# Patient Record
Sex: Female | Born: 1960 | Race: Asian | Hispanic: No | Marital: Married | Smoking: Never smoker
Health system: Southern US, Community
[De-identification: ages and names within clinical notes are randomized; demographics above are authoritative.]

## PROBLEM LIST (undated history)

## (undated) DIAGNOSIS — R42 Dizziness and giddiness: Secondary | ICD-10-CM

## (undated) DIAGNOSIS — T7840XA Allergy, unspecified, initial encounter: Secondary | ICD-10-CM

## (undated) HISTORY — PX: RECTAL SURGERY: SHX760

## (undated) HISTORY — PX: TUBAL LIGATION: SHX77

## (undated) HISTORY — PX: BLADDER SURGERY: SHX569

## (undated) HISTORY — PX: HERNIA REPAIR: SHX51

## (undated) HISTORY — PX: ABDOMINAL HYSTERECTOMY: SHX81

---

## 2009-09-17 ENCOUNTER — Ambulatory Visit: Payer: Self-pay | Admitting: General Surgery

## 2009-10-26 ENCOUNTER — Ambulatory Visit: Payer: Self-pay | Admitting: General Surgery

## 2010-06-25 ENCOUNTER — Ambulatory Visit: Payer: Self-pay | Admitting: Unknown Physician Specialty

## 2011-09-28 ENCOUNTER — Emergency Department: Payer: Self-pay | Admitting: Emergency Medicine

## 2012-05-12 ENCOUNTER — Ambulatory Visit: Payer: Self-pay | Admitting: Internal Medicine

## 2012-06-25 DIAGNOSIS — R42 Dizziness and giddiness: Secondary | ICD-10-CM | POA: Insufficient documentation

## 2013-04-27 ENCOUNTER — Ambulatory Visit: Payer: Self-pay | Admitting: Internal Medicine

## 2013-04-28 DIAGNOSIS — K760 Fatty (change of) liver, not elsewhere classified: Secondary | ICD-10-CM | POA: Insufficient documentation

## 2013-08-30 ENCOUNTER — Ambulatory Visit: Payer: Self-pay | Admitting: Family Medicine

## 2015-09-28 ENCOUNTER — Encounter: Payer: Self-pay | Admitting: Emergency Medicine

## 2015-09-28 ENCOUNTER — Emergency Department
Admission: EM | Admit: 2015-09-28 | Discharge: 2015-09-28 | Disposition: A | Discharge status: Home / Self Care | Payer: BLUE CROSS/BLUE SHIELD | Attending: Emergency Medicine | Admitting: Emergency Medicine

## 2015-09-28 ENCOUNTER — Emergency Department: Payer: BLUE CROSS/BLUE SHIELD

## 2015-09-28 DIAGNOSIS — Z880 Allergy status to penicillin: Secondary | ICD-10-CM | POA: Insufficient documentation

## 2015-09-28 DIAGNOSIS — F419 Anxiety disorder, unspecified: Secondary | ICD-10-CM

## 2015-09-28 DIAGNOSIS — R0789 Other chest pain: Secondary | ICD-10-CM | POA: Diagnosis present

## 2015-09-28 HISTORY — DX: Allergy, unspecified, initial encounter: T78.40XA

## 2015-09-28 LAB — URINALYSIS COMPLETE WITH MICROSCOPIC (ARMC ONLY)
BACTERIA UA: NONE SEEN
Bilirubin Urine: NEGATIVE
Glucose, UA: NEGATIVE mg/dL
Hgb urine dipstick: NEGATIVE
Ketones, ur: NEGATIVE mg/dL
Leukocytes, UA: NEGATIVE
Nitrite: NEGATIVE
PROTEIN: NEGATIVE mg/dL
SPECIFIC GRAVITY, URINE: 1.009 (ref 1.005–1.030)
pH: 7 (ref 5.0–8.0)

## 2015-09-28 LAB — COMPREHENSIVE METABOLIC PANEL
ALBUMIN: 4.8 g/dL (ref 3.5–5.0)
ALT: 28 U/L (ref 14–54)
ANION GAP: 7 (ref 5–15)
AST: 26 U/L (ref 15–41)
Alkaline Phosphatase: 128 U/L — ABNORMAL HIGH (ref 38–126)
BUN: 14 mg/dL (ref 6–20)
CHLORIDE: 108 mmol/L (ref 101–111)
CO2: 26 mmol/L (ref 22–32)
Calcium: 9.4 mg/dL (ref 8.9–10.3)
Creatinine, Ser: 0.65 mg/dL (ref 0.44–1.00)
GFR calc Af Amer: >60 mL/min (ref >60)
Glucose, Bld: 99 mg/dL (ref 65–99)
POTASSIUM: 3.4 mmol/L — AB (ref 3.5–5.1)
Sodium: 141 mmol/L (ref 135–145)
TOTAL PROTEIN: 8.2 g/dL — AB (ref 6.5–8.1)
Total Bilirubin: 1.5 mg/dL — ABNORMAL HIGH (ref 0.3–1.2)

## 2015-09-28 LAB — CBC WITH DIFFERENTIAL/PLATELET
Basophils Absolute: 0.2 10*3/uL — ABNORMAL HIGH (ref 0–0.1)
Basophils Relative: 5 %
Eosinophils Absolute: 0.1 10*3/uL (ref 0–0.7)
Eosinophils Relative: 3 %
HEMATOCRIT: 48.1 % — AB (ref 35.0–47.0)
Hemoglobin: 16.3 g/dL — ABNORMAL HIGH (ref 12.0–16.0)
LYMPHS ABS: 1.2 10*3/uL (ref 1.0–3.6)
MCH: 29.5 pg (ref 26.0–34.0)
MCHC: 33.9 g/dL (ref 32.0–36.0)
MCV: 87.1 fL (ref 80.0–100.0)
MONO ABS: 0.3 10*3/uL (ref 0.2–0.9)
NEUTROS ABS: 2.7 10*3/uL (ref 1.4–6.5)
Neutrophils Relative %: 58 %
Platelets: 234 10*3/uL (ref 150–400)
RBC: 5.52 MIL/uL — ABNORMAL HIGH (ref 3.80–5.20)
RDW: 12.8 % (ref 11.5–14.5)
WBC: 4.5 10*3/uL (ref 3.6–11.0)

## 2015-09-28 LAB — TROPONIN I

## 2015-09-28 MED ORDER — ONDANSETRON 4 MG PO TBDP
ORAL_TABLET | ORAL | Status: AC
  Filled 2015-09-28: qty 1

## 2015-09-28 MED ORDER — ONDANSETRON 4 MG PO TBDP: 4.0000 mg | ORAL_TABLET | Freq: Once | ORAL | Status: DC

## 2015-09-28 NOTE — ED Notes (Addendum)
Patient to ER via EMS for c/o allergic reaction. Patient had flu shot approx one hour prior to arrival. States this is third year patient has received flu shot and never had reaction until now. States after injection, patient developed shaking, chest tightness and nausea. States chest tightness is very minimal now, but continues to be nauseated. Received  PO Benadryl from EMS. Patient in no acute distress. Patient has h/o allergic reaction has been prescribed Epi pen rx, but has never used.

## 2015-09-28 NOTE — ED Provider Notes (Signed)
[facility] Emergency Department Provider Note  ____________________________________________  Time seen: Approximately 9:10 AM  I have reviewed the triage vital signs and the nursing notes.   HISTORY  Chief Complaint Allergic Reaction   HPI Isabella Barnes is a 54 y.o. female patient is being seen for an allergic reaction. She arrived via EMS with a reported reaction to flu shot that she got one hour prior to her arrival. Patient states this is a third-year that she has received a flu shot has never had a reaction until now. She describes her reaction as shaking all over, chest tightness and nausea, and then she passed out. Patient denies any prior cardiac history other than hypertension. She is uncertain how long she was "passed out". EMS was called and patient received Benadryl 25 mg by mouth. Patient states that she has a history of allergic reactions and was prescribed an EpiPen but she has never used it. Currently she states her chest tightness is improving. She continues to complain of some nausea but no vomiting.   Past Medical History  Diagnosis Date  . Allergic reaction     There are no active problems to display for this patient.   Past Surgical History  Procedure Laterality Date  . Abdominal hysterectomy    . Tubal ligation    . Cesarean section    . Hernia repair    . Bladder surgery    . Rectal surgery      No current outpatient prescriptions on file.  Allergies Chloraseptic sore throat; Diflucan; Multivitamin; and Penicillins  No family history on file.  Social History Social History  Substance Use Topics  . Smoking status: Never Smoker   . Smokeless tobacco: None  . Alcohol Use: No    Review of Systems Constitutional: No fever/chills Eyes: No visual changes. ENT: No sore throat. Cardiovascular: Denies chest pain. Respiratory: Positive shortness of breath. Gastrointestinal: No abdominal pain.  Positive nausea, no  vomiting.  No diarrhea.  No constipation. Genitourinary: Negative for dysuria. Musculoskeletal: Negative for back pain. Skin: Negative for rash. Neurological: Negative for headaches, focal weakness or numbness.  10-point ROS otherwise negative.  ____________________________________________   PHYSICAL EXAM:  VITAL SIGNS: ED Triage Vitals  Enc Vitals Group     BP --      Pulse --      Resp --      Temp --      Temp src --      SpO2 --      Weight --      Height --      Head Cir --      Peak Flow --      Pain Score --      Pain Loc --      Pain Edu? --      Excl. in GC? --     Constitutional: Alert and oriented. Well appearing and in no acute distress. Patient is able to speak in complete sentences without any respiratory difficulty. Eyes: Conjunctivae are normal. PERRL. EOMI. Head: Atraumatic. Nose: No congestion/rhinnorhea. Mouth/Throat: Mucous membranes are moist.  Oropharynx non-erythematous. No posterior edema noted Neck: No stridor.  Supple Hematological/Lymphatic/Immunilogical: No cervical lymphadenopathy. Cardiovascular: Normal rate, regular rhythm. Grossly normal heart sounds.  Good peripheral circulation. Respiratory: Normal respiratory effort.  No retractions. Lungs CTAB. Gastrointestinal: Soft and nontender. No distention. Musculoskeletal: Moves upper extremities without difficulty No lower extremity tenderness nor edema.  No joint effusions. Neurologic:  Normal speech and language. No  gross focal neurologic deficits are appreciated. No gait instability. Skin:  Skin is warm, dry and intact. No rash noted. Psychiatric: Mood and affect are normal. Speech and behavior are normal.  ____________________________________________   LABS (all labs ordered are listed, but only abnormal results are displayed)  Labs Reviewed  CBC WITH DIFFERENTIAL/PLATELET - Abnormal; Notable for the following:    RBC 5.52 (*)    Hemoglobin 16.3 (*)    HCT 48.1 (*)    Basophils  Absolute 0.2 (*)    All other components within normal limits  URINALYSIS COMPLETEWITH MICROSCOPIC ([facility] ONLY) - Abnormal; Notable for the following:    Color, Urine STRAW (*)    APPearance CLEAR (*)    Squamous Epithelial / LPF 0-5 (*)    All other components within normal limits  COMPREHENSIVE METABOLIC PANEL - Abnormal; Notable for the following:    Potassium 3.4 (*)    Total Protein 8.2 (*)    Alkaline Phosphatase 128 (*)    Total Bilirubin 1.5 (*)    All other components within normal limits  TROPONIN I   ____________________________________________  EKG  As reported by Dr. Juliette Alcide ____________________________________________  RADIOLOGY Chest x-ray per radiologist shows no active cardiopulmonary disease.  ____________________________________________   PROCEDURES  Procedure(s) performed: None  Critical Care performed: No  ____________________________________________   INITIAL IMPRESSION / ASSESSMENT AND PLAN / ED COURSE  Pertinent labs & imaging results that were available during my care of the patient were reviewed by me and considered in my medical decision making (see chart for details).  Patient was discharged with instructions to take Benadryl every 6 hours if needed for any itching or allergy type symptoms. During the course of her stay in the emergency room she did not have any type of respiratory difficulty, she continued to talk to her family without any difficulty. Nausea was resolved without any further medication. There continued to be no rash. Patient is return if any severe worsening of her symptoms or urgent concerns. She is to follow-up with her primary care doctor if any continued problems as well. ____________________________________________   FINAL CLINICAL IMPRESSION(S) / ED DIAGNOSES  Final diagnoses:  Anxiety  resolved    Tommi Rumps, PA-C 09/28/15 1334  Arnaldo Natal, MD 09/28/15 [phone]

## 2015-09-28 NOTE — Discharge Instructions (Signed)
Follow-up with her doctor if any continued problems. you may take Benadryl if needed in 6 hours.

## 2015-09-28 NOTE — ED Notes (Addendum)
Lab notified that patient's red and green top tubes have been clotted and will need recollected. Lab notified that a tech from lab will need to come and stick patient.

## 2016-11-14 ENCOUNTER — Other Ambulatory Visit: Payer: Self-pay | Admitting: Family Medicine

## 2016-11-14 DIAGNOSIS — Z1231 Encounter for screening mammogram for malignant neoplasm of breast: Secondary | ICD-10-CM

## 2016-12-25 ENCOUNTER — Ambulatory Visit
Admission: RE | Admit: 2016-12-25 | Discharge: 2016-12-25 | Disposition: A | Discharge status: Home / Self Care | Payer: BLUE CROSS/BLUE SHIELD | Source: Ambulatory Visit | Attending: Family Medicine | Admitting: Family Medicine

## 2016-12-25 DIAGNOSIS — Z1231 Encounter for screening mammogram for malignant neoplasm of breast: Secondary | ICD-10-CM | POA: Diagnosis not present

## 2017-01-01 ENCOUNTER — Other Ambulatory Visit: Payer: Self-pay | Admitting: Family Medicine

## 2017-01-01 DIAGNOSIS — R928 Other abnormal and inconclusive findings on diagnostic imaging of breast: Secondary | ICD-10-CM

## 2017-01-01 DIAGNOSIS — N6489 Other specified disorders of breast: Secondary | ICD-10-CM

## 2017-01-07 ENCOUNTER — Other Ambulatory Visit: Payer: BLUE CROSS/BLUE SHIELD

## 2017-01-07 ENCOUNTER — Ambulatory Visit: Payer: BLUE CROSS/BLUE SHIELD

## 2017-01-28 ENCOUNTER — Ambulatory Visit
Admission: RE | Admit: 2017-01-28 | Discharge: 2017-01-28 | Disposition: A | Discharge status: Home / Self Care | Payer: BLUE CROSS/BLUE SHIELD | Source: Ambulatory Visit | Attending: Family Medicine | Admitting: Family Medicine

## 2017-01-28 DIAGNOSIS — R928 Other abnormal and inconclusive findings on diagnostic imaging of breast: Secondary | ICD-10-CM

## 2017-01-28 DIAGNOSIS — N6489 Other specified disorders of breast: Secondary | ICD-10-CM

## 2017-03-04 DIAGNOSIS — K430 Incisional hernia with obstruction, without gangrene: Secondary | ICD-10-CM | POA: Insufficient documentation

## 2017-12-09 ENCOUNTER — Encounter: Payer: Self-pay | Admitting: Emergency Medicine

## 2017-12-09 ENCOUNTER — Emergency Department
Admission: EM | Admit: 2017-12-09 | Discharge: 2017-12-09 | Disposition: A | Discharge status: Home / Self Care | Payer: BLUE CROSS/BLUE SHIELD | Attending: Emergency Medicine | Admitting: Emergency Medicine

## 2017-12-09 DIAGNOSIS — Z79899 Other long term (current) drug therapy: Secondary | ICD-10-CM | POA: Diagnosis not present

## 2017-12-09 DIAGNOSIS — R112 Nausea with vomiting, unspecified: Secondary | ICD-10-CM

## 2017-12-09 DIAGNOSIS — I10 Essential (primary) hypertension: Secondary | ICD-10-CM

## 2017-12-09 DIAGNOSIS — Z853 Personal history of malignant neoplasm of breast: Secondary | ICD-10-CM | POA: Diagnosis not present

## 2017-12-09 DIAGNOSIS — E876 Hypokalemia: Secondary | ICD-10-CM

## 2017-12-09 HISTORY — DX: Dizziness and giddiness: R42

## 2017-12-09 LAB — CBC WITH DIFFERENTIAL/PLATELET
Basophils Absolute: 0 10*3/uL (ref 0–0.1)
Basophils Relative: 0 %
EOS ABS: 0.1 10*3/uL (ref 0–0.7)
Eosinophils Relative: 2 %
HCT: 47 % (ref 35.0–47.0)
HEMOGLOBIN: 16.3 g/dL — AB (ref 12.0–16.0)
Lymphocytes Relative: 37 %
Lymphs Abs: 1.8 10*3/uL (ref 1.0–3.6)
MCH: 29.9 pg (ref 26.0–34.0)
MCHC: 34.6 g/dL (ref 32.0–36.0)
MCV: 86.4 fL (ref 80.0–100.0)
Monocytes Absolute: 0.4 10*3/uL (ref 0.2–0.9)
Monocytes Relative: 9 %
NEUTROS PCT: 52 %
Neutro Abs: 2.6 10*3/uL (ref 1.4–6.5)
PLATELETS: 324 10*3/uL (ref 150–440)
RBC: 5.44 MIL/uL — AB (ref 3.80–5.20)
RDW: 12.5 % (ref 11.5–14.5)
WBC: 5 10*3/uL (ref 3.6–11.0)

## 2017-12-09 LAB — BASIC METABOLIC PANEL
ANION GAP: 11 (ref 5–15)
BUN: 14 mg/dL (ref 6–20)
CALCIUM: 9.4 mg/dL (ref 8.9–10.3)
CO2: 26 mmol/L (ref 22–32)
Chloride: 104 mmol/L (ref 101–111)
Creatinine, Ser: 0.67 mg/dL (ref 0.44–1.00)
Glucose, Bld: 108 mg/dL — ABNORMAL HIGH (ref 65–99)
Potassium: 3.3 mmol/L — ABNORMAL LOW (ref 3.5–5.1)
Sodium: 141 mmol/L (ref 135–145)

## 2017-12-09 LAB — TROPONIN I: Troponin I: <0.03 ng/mL (ref <0.03)

## 2017-12-09 MED ORDER — POTASSIUM CHLORIDE CRYS ER 20 MEQ PO TBCR
40.0000 meq | EXTENDED_RELEASE_TABLET | Freq: Once | ORAL | Status: AC
  Administered 2017-12-09: 40 meq via ORAL
  Filled 2017-12-09: qty 2

## 2017-12-09 MED ORDER — ONDANSETRON HCL 4 MG PO TABS
4.0000 mg | ORAL_TABLET | Freq: Once | ORAL | Status: AC
  Administered 2017-12-09: 4 mg via ORAL
  Filled 2017-12-09: qty 1

## 2017-12-09 MED ORDER — ALUM & MAG HYDROXIDE-SIMETH 200-200-20 MG/5ML PO SUSP
30.0000 mL | Freq: Once | ORAL | Status: AC
  Administered 2017-12-09: 30 mL via ORAL
  Filled 2017-12-09: qty 30

## 2017-12-09 NOTE — ED Notes (Signed)
Nurse attempted to draw blood and PIV. Pt let nurse stick once and stating "get someone else to draw."

## 2017-12-09 NOTE — ED Notes (Signed)
Pt stating that her nausea is much better and is stating that she believes it is her "heartburn." Pt denying wanting any other medications at this time. Nurse stating that Dr. Shaune PollackLord would be in with her soon.

## 2017-12-09 NOTE — ED Triage Notes (Signed)
Pt was at work per EMS, when she became nauseated and dizzy. Pt stating that she went to check her BP but the machine was missing so her facility had their medical team check her out. Pt stating BP was 170s/90s. Pt stating that she is still feeling a little nauseated but that the lightheadedness and dizziness have resolved.
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
## 2017-12-09 NOTE — Discharge Instructions (Signed)
You are evaluated for nausea episode and elevated blood pressure, and found to have slightly low potassium and was given potassium supplement here.  Overall your exam and evaluation are reassuring today in the emergency department.  Although no certain cause was found, no serious emergency condition is suspected.  Return to the emergency department immediately if you have any new or worsening condition including chest pain, weakness, numbness, confusion or altered mental status, palpitations, passing out, or any other symptoms concerning to you.

## 2017-12-24 ENCOUNTER — Other Ambulatory Visit: Payer: Self-pay | Admitting: Family Medicine

## 2017-12-24 DIAGNOSIS — Z1231 Encounter for screening mammogram for malignant neoplasm of breast: Secondary | ICD-10-CM

## 2018-02-01 ENCOUNTER — Ambulatory Visit
Admission: RE | Admit: 2018-02-01 | Discharge: 2018-02-01 | Disposition: A | Discharge status: Home / Self Care | Payer: BLUE CROSS/BLUE SHIELD | Source: Ambulatory Visit | Attending: Family Medicine | Admitting: Family Medicine

## 2018-02-01 DIAGNOSIS — Z1231 Encounter for screening mammogram for malignant neoplasm of breast: Secondary | ICD-10-CM | POA: Diagnosis present

## 2018-03-19 ENCOUNTER — Ambulatory Visit
Admission: RE | Admit: 2018-03-19 | Discharge: 2018-03-19 | Disposition: A | Discharge status: Home / Self Care | Payer: BLUE CROSS/BLUE SHIELD | Source: Ambulatory Visit | Attending: Medical Oncology | Admitting: Medical Oncology

## 2018-03-19 ENCOUNTER — Other Ambulatory Visit: Payer: Self-pay | Admitting: Medical Oncology

## 2018-03-19 DIAGNOSIS — R918 Other nonspecific abnormal finding of lung field: Secondary | ICD-10-CM | POA: Diagnosis not present

## 2018-03-19 DIAGNOSIS — R059 Cough, unspecified: Secondary | ICD-10-CM

## 2018-03-19 DIAGNOSIS — R05 Cough: Secondary | ICD-10-CM | POA: Insufficient documentation

## 2018-03-19 DIAGNOSIS — R0602 Shortness of breath: Secondary | ICD-10-CM

## 2019-07-06 ENCOUNTER — Other Ambulatory Visit: Payer: Self-pay | Admitting: Family Medicine

## 2019-07-06 DIAGNOSIS — Z1231 Encounter for screening mammogram for malignant neoplasm of breast: Secondary | ICD-10-CM

## 2019-08-25 ENCOUNTER — Other Ambulatory Visit: Payer: Self-pay | Admitting: Family Medicine

## 2019-08-25 DIAGNOSIS — Z1231 Encounter for screening mammogram for malignant neoplasm of breast: Secondary | ICD-10-CM

## 2019-09-28 ENCOUNTER — Ambulatory Visit: Payer: POS | Admitting: Sports Medicine

## 2019-09-28 ENCOUNTER — Other Ambulatory Visit: Payer: Self-pay

## 2019-09-28 ENCOUNTER — Other Ambulatory Visit: Payer: Self-pay | Admitting: Sports Medicine

## 2019-09-28 ENCOUNTER — Ambulatory Visit (INDEPENDENT_AMBULATORY_CARE_PROVIDER_SITE_OTHER): Payer: POS

## 2019-09-28 ENCOUNTER — Encounter: Payer: Self-pay | Admitting: Sports Medicine

## 2019-09-28 DIAGNOSIS — M79671 Pain in right foot: Secondary | ICD-10-CM

## 2019-09-28 DIAGNOSIS — M7751 Other enthesopathy of right foot: Secondary | ICD-10-CM | POA: Diagnosis not present

## 2019-09-28 DIAGNOSIS — M19071 Primary osteoarthritis, right ankle and foot: Secondary | ICD-10-CM | POA: Diagnosis not present

## 2019-09-28 DIAGNOSIS — T148XXA Other injury of unspecified body region, initial encounter: Secondary | ICD-10-CM

## 2019-09-28 DIAGNOSIS — E7800 Pure hypercholesterolemia, unspecified: Secondary | ICD-10-CM | POA: Insufficient documentation

## 2019-09-28 DIAGNOSIS — M779 Enthesopathy, unspecified: Secondary | ICD-10-CM

## 2019-09-28 DIAGNOSIS — M778 Other enthesopathies, not elsewhere classified: Secondary | ICD-10-CM

## 2019-09-28 DIAGNOSIS — R7301 Impaired fasting glucose: Secondary | ICD-10-CM | POA: Insufficient documentation

## 2019-09-28 DIAGNOSIS — R7989 Other specified abnormal findings of blood chemistry: Secondary | ICD-10-CM | POA: Insufficient documentation

## 2019-09-28 DIAGNOSIS — I10 Essential (primary) hypertension: Secondary | ICD-10-CM | POA: Insufficient documentation

## 2019-09-28 MED ORDER — MELOXICAM 15 MG PO TABS: 15.0000 mg | ORAL_TABLET | Freq: Every day | ORAL | 0 refills | Status: DC

## 2019-09-28 NOTE — Progress Notes (Signed)
Subjective: Sharmayne Jablon is a 58 y.o. female patient who presents to office for evaluation of right foot pain. Patient complains of progressive pain especially over the last month and the right foot reports that she has slowly noticed a knot over the top of her foot for the last month states that she does a lot of walking and has been doing some hiking and reports that there is some constant pain over the top of the area however this pain does not limit her from walking for exercise states that she has tried certain shoes and sometimes this aggravates the area with some occasional swelling has been taking Tylenol and ibuprofen which helps some but became concerned because of the progressive Knot over the area.  Patient also admits to a little bit of popping when she is walking and standing but denies any type of acute trauma or injury to the area.  Review of Systems  All other systems reviewed and are negative.   Patient Active Problem List   Diagnosis Date Noted  . Benign essential hypertension 09/28/2019  . Impaired fasting glucose 09/28/2019  . Other specified abnormal findings of blood chemistry 09/28/2019  . Pure hypercholesterolemia 09/28/2019  . 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 Fatty liver 04/28/2013  . Dizziness 06/25/2012    Current Outpatient Medications on File Prior to Visit  Medication Sig Dispense Refill  . amLODipine (NORVASC) 10 MG tablet Take 1 tablet by mouth daily.    . cetirizine (ZYRTEC) 10 MG tablet Take 1 tablet by mouth daily.    Marland Kitchen omeprazole (PRILOSEC) 20 MG capsule Take 1 capsule by mouth daily.     No current facility-administered medications on file prior to visit.     Allergies  Allergen Reactions  . Influenza Vaccines Nausea Only, Shortness Of Breath and Other (See Comments)  . Benzocaine-Menthol Nausea And Vomiting  . Chloraseptic Sore Throat [Acetaminophen] Nausea And Vomiting  . Diflucan [Fluconazole]   .  Metronidazole Other (See Comments)  . Multivitamin [Centrum] Nausea Only  . Multivitamins Nausea Only  . Other Other (See Comments)    Trees  . Penicillins Nausea Only  . Phenol Nausea And Vomiting  . Sulfa Antibiotics Other (See Comments)    Objective:  General: Alert and oriented x3 in no acute distress  Dermatology: Raised hard bony bony spot at the dorsal lateral aspect of the right foot likely consistent with exostosis, no open lesions bilateral lower extremities, no webspace macerations, no ecchymosis bilateral, all nails x 10 are well manicured.  Vascular: Dorsalis Pedis and Posterior Tibial pedal pulses palpable, Capillary Fill Time 3 seconds,(+) pedal hair growth bilateral, no edema bilateral lower extremities, Temperature gradient within normal limits.  Neurology: Gross sensation intact via light touch bilateral, negative Tinel signs on right.  Musculoskeletal: Mild tenderness with palpation at right midfoot with palpable bone spur pain is worsened with plantarflexion and inversion of the midtarsal joint.  Strength within normal limits in all groups bilateral.   Gait: Antalgic gait  Xrays  Right foot   Impression: There is bony overlap at the dorsal midfoot especially at the base of the third or fourth metatarsal cuneiform joint with bone spur consistent with arthritis with some surrounding changes likely a bony contusion to the area.  Soft tissue margins are within normal limits.  No other acute findings.  Assessment and Plan: Problem List Items Addressed This Visit    None    Visit Diagnoses    Bone spur    -  Primary   Relevant Medications   meloxicam (MOBIC) 15 MG tablet   Arthritis of right foot       Relevant Medications   meloxicam (MOBIC) 15 MG tablet   Capsulitis of right foot       Contusion of bone       Right foot pain          -Complete examination performed -Xrays reviewed -Discussed treatement options for bone spur with arthritis -Rx meloxicam  to take as instructed -Dispensed anklet/fascial support brace to support her midfoot to take any stress and strain off of this joint especially when she is active with walking and advised good supportive shoes -Advised icing after activity -Patient to return to office if pain is not better after 1 month or sooner if condition worsens.  Asencion Islam, DPM

## 2019-10-20 ENCOUNTER — Other Ambulatory Visit: Payer: Self-pay | Admitting: Sports Medicine

## 2019-10-20 DIAGNOSIS — M779 Enthesopathy, unspecified: Secondary | ICD-10-CM

## 2019-10-20 NOTE — Telephone Encounter (Signed)
Dr. Stover please advice 

## 2019-11-13 ENCOUNTER — Other Ambulatory Visit: Payer: Self-pay | Admitting: Sports Medicine

## 2019-11-13 DIAGNOSIS — M779 Enthesopathy, unspecified: Secondary | ICD-10-CM

## 2019-11-14 NOTE — Telephone Encounter (Signed)
Dr. Stover please advice 

## 2019-12-13 ENCOUNTER — Other Ambulatory Visit: Payer: Self-pay | Admitting: Sports Medicine

## 2019-12-13 DIAGNOSIS — M779 Enthesopathy, unspecified: Secondary | ICD-10-CM

## 2019-12-13 NOTE — Telephone Encounter (Signed)
Dr. Stover please advice 

## 2020-01-02 ENCOUNTER — Ambulatory Visit
Admission: RE | Admit: 2020-01-02 | Discharge: 2020-01-02 | Disposition: A | Discharge status: Home / Self Care | Payer: POS | Source: Ambulatory Visit | Attending: Family Medicine | Admitting: Family Medicine

## 2020-01-02 DIAGNOSIS — Z1231 Encounter for screening mammogram for malignant neoplasm of breast: Secondary | ICD-10-CM | POA: Diagnosis not present

## 2020-08-18 LAB — COLOGUARD: COLOGUARD: NEGATIVE

## 2021-01-17 ENCOUNTER — Other Ambulatory Visit: Payer: Self-pay | Admitting: Family Medicine

## 2021-01-17 DIAGNOSIS — Z1231 Encounter for screening mammogram for malignant neoplasm of breast: Secondary | ICD-10-CM

## 2021-02-08 ENCOUNTER — Other Ambulatory Visit: Payer: Self-pay

## 2021-02-08 ENCOUNTER — Ambulatory Visit
Admission: RE | Admit: 2021-02-08 | Discharge: 2021-02-08 | Disposition: A | Discharge status: Home / Self Care | Payer: POS | Source: Ambulatory Visit | Attending: Family Medicine | Admitting: Family Medicine

## 2021-02-08 DIAGNOSIS — Z1231 Encounter for screening mammogram for malignant neoplasm of breast: Secondary | ICD-10-CM | POA: Insufficient documentation

## 2021-12-06 ENCOUNTER — Other Ambulatory Visit: Payer: Self-pay | Admitting: Family Medicine

## 2021-12-06 DIAGNOSIS — Z1231 Encounter for screening mammogram for malignant neoplasm of breast: Secondary | ICD-10-CM

## 2022-06-05 ENCOUNTER — Ambulatory Visit
Admission: RE | Admit: 2022-06-05 | Discharge: 2022-06-05 | Disposition: A | Discharge status: Home / Self Care | Payer: POS | Source: Ambulatory Visit | Attending: Family Medicine | Admitting: Family Medicine

## 2022-06-05 DIAGNOSIS — Z1231 Encounter for screening mammogram for malignant neoplasm of breast: Secondary | ICD-10-CM | POA: Diagnosis not present

## 2022-06-11 ENCOUNTER — Other Ambulatory Visit: Payer: Self-pay | Admitting: Family Medicine

## 2022-06-11 DIAGNOSIS — R928 Other abnormal and inconclusive findings on diagnostic imaging of breast: Secondary | ICD-10-CM

## 2022-06-17 ENCOUNTER — Other Ambulatory Visit: Payer: Self-pay | Admitting: Family Medicine

## 2022-06-17 DIAGNOSIS — E876 Hypokalemia: Secondary | ICD-10-CM

## 2022-06-17 DIAGNOSIS — R748 Abnormal levels of other serum enzymes: Secondary | ICD-10-CM

## 2022-06-18 ENCOUNTER — Ambulatory Visit
Admission: RE | Admit: 2022-06-18 | Discharge: 2022-06-18 | Disposition: A | Discharge status: Home / Self Care | Payer: POS | Source: Ambulatory Visit | Attending: Family Medicine | Admitting: Family Medicine

## 2022-06-18 DIAGNOSIS — R928 Other abnormal and inconclusive findings on diagnostic imaging of breast: Secondary | ICD-10-CM | POA: Diagnosis not present

## 2022-07-23 ENCOUNTER — Other Ambulatory Visit: Payer: POS

## 2022-07-28 ENCOUNTER — Other Ambulatory Visit: Payer: POS

## 2022-08-25 ENCOUNTER — Other Ambulatory Visit: Payer: POS

## 2022-08-26 ENCOUNTER — Ambulatory Visit
Admission: RE | Admit: 2022-08-26 | Discharge: 2022-08-26 | Disposition: A | Discharge status: Home / Self Care | Payer: POS | Source: Ambulatory Visit | Attending: Family Medicine | Admitting: Family Medicine

## 2022-08-26 DIAGNOSIS — E559 Vitamin D deficiency, unspecified: Secondary | ICD-10-CM | POA: Diagnosis not present

## 2022-08-26 DIAGNOSIS — Z780 Asymptomatic menopausal state: Secondary | ICD-10-CM | POA: Diagnosis not present

## 2022-08-26 DIAGNOSIS — E876 Hypokalemia: Secondary | ICD-10-CM | POA: Insufficient documentation

## 2022-08-26 DIAGNOSIS — R748 Abnormal levels of other serum enzymes: Secondary | ICD-10-CM | POA: Diagnosis present

## 2022-12-05 ENCOUNTER — Other Ambulatory Visit: Payer: Self-pay | Admitting: Family Medicine

## 2022-12-05 DIAGNOSIS — Z1231 Encounter for screening mammogram for malignant neoplasm of breast: Secondary | ICD-10-CM

## 2023-02-16 ENCOUNTER — Other Ambulatory Visit: Payer: Self-pay | Admitting: Family Medicine

## 2023-02-16 DIAGNOSIS — Z1231 Encounter for screening mammogram for malignant neoplasm of breast: Secondary | ICD-10-CM

## 2023-09-14 ENCOUNTER — Ambulatory Visit
Admission: RE | Admit: 2023-09-14 | Discharge: 2023-09-14 | Disposition: A | Discharge status: Home / Self Care | Payer: 59 | Source: Ambulatory Visit | Attending: Family Medicine | Admitting: Family Medicine

## 2023-09-14 DIAGNOSIS — Z1231 Encounter for screening mammogram for malignant neoplasm of breast: Secondary | ICD-10-CM | POA: Insufficient documentation

## 2023-11-03 IMAGING — MG MM DIGITAL SCREENING BILAT W/ TOMO AND CAD
8 series · 8 of 24 positions shown · non-contrast
Comparison: Previous exam(s).

CLINICAL DATA: Screening.

EXAM:
DIGITAL SCREENING BILATERAL MAMMOGRAM WITH TOMOSYNTHESIS AND CAD
TECHNIQUE: Bilateral screening digital craniocaudal and mediolateral oblique
mammograms were obtained. Bilateral screening digital breast
tomosynthesis was performed. The images were evaluated with
computer-aided detection.

[L MLO synth-2D]
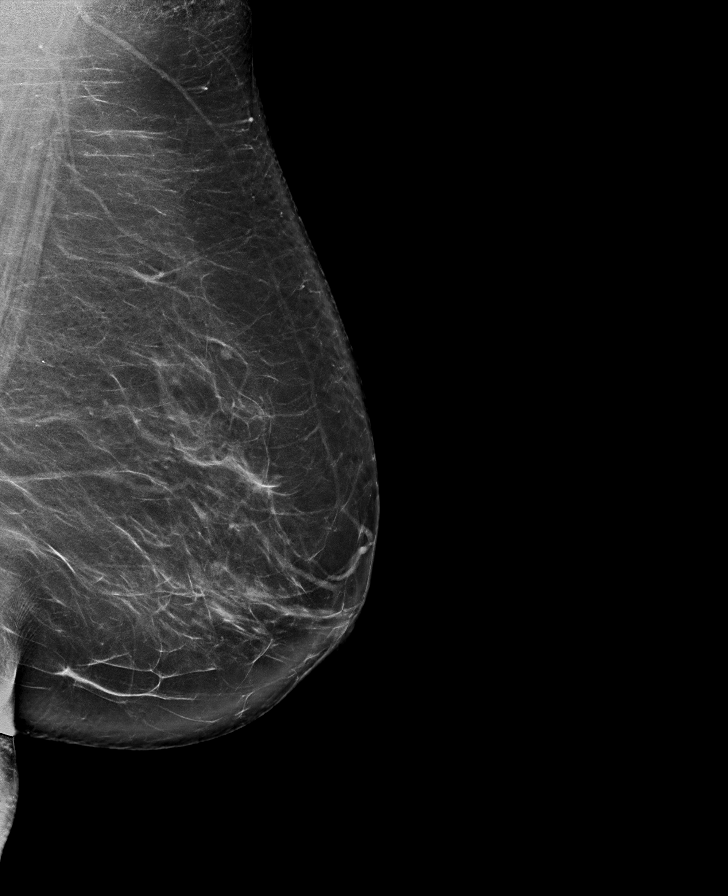

[R MLO synth-2D]
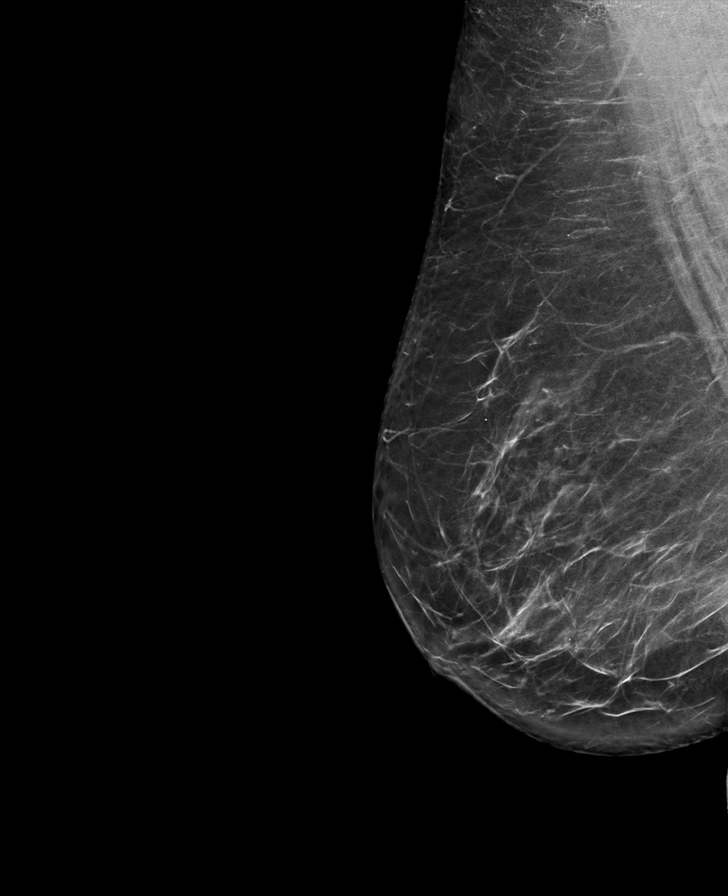

[R CC synth-2D]
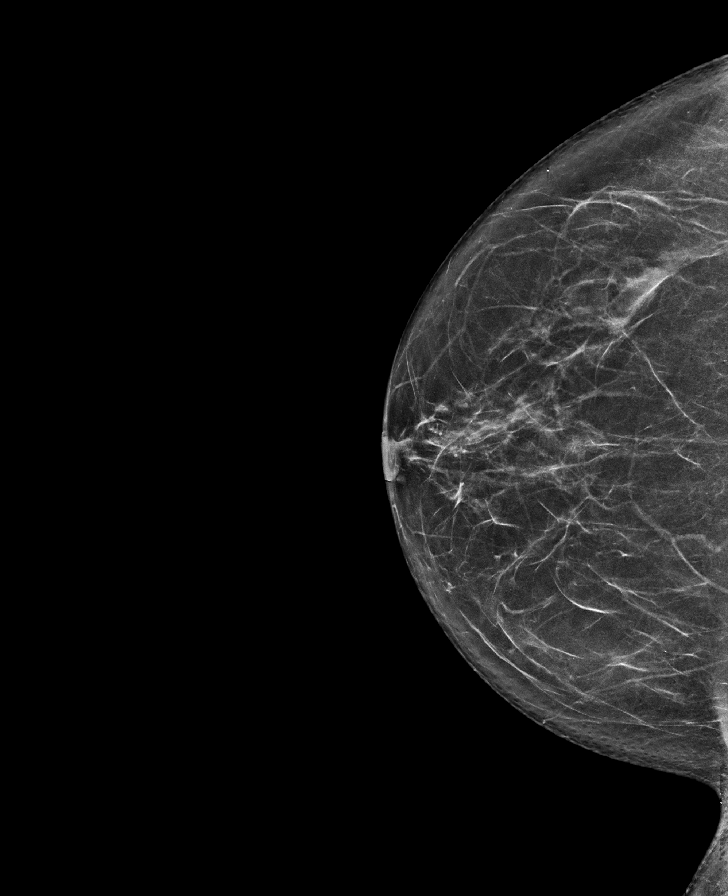

[L CC synth-2D]
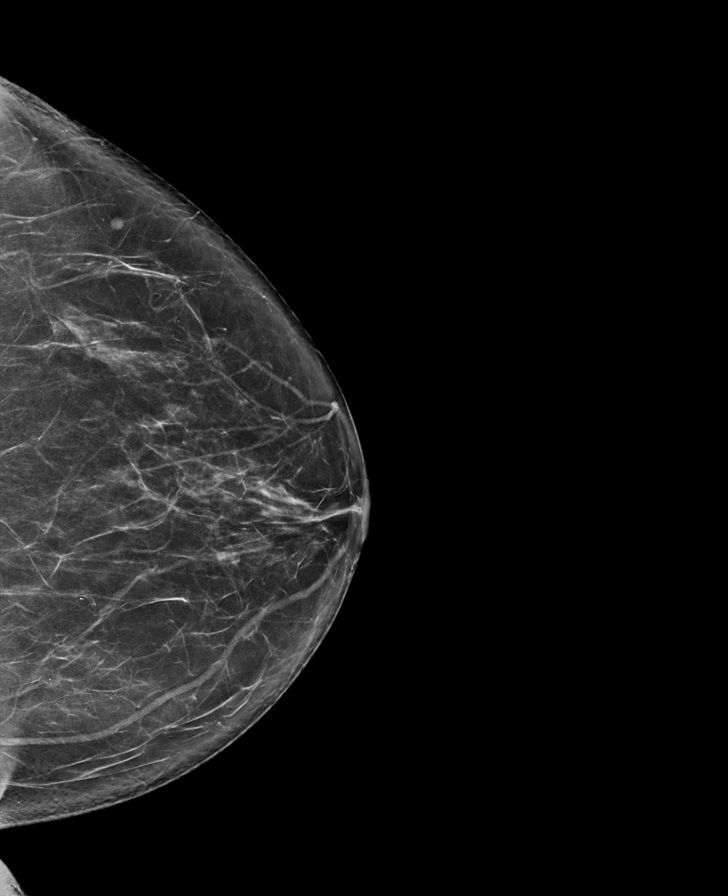

[R MLO tomo · tomo slice 43/86.0]
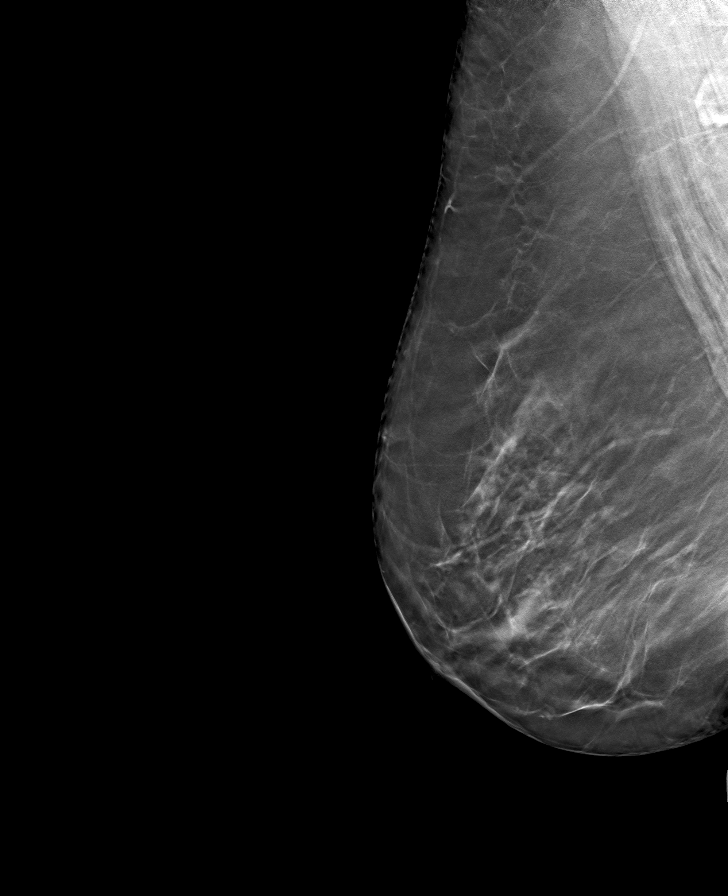

[R CC tomo · tomo slice 37/74.0]
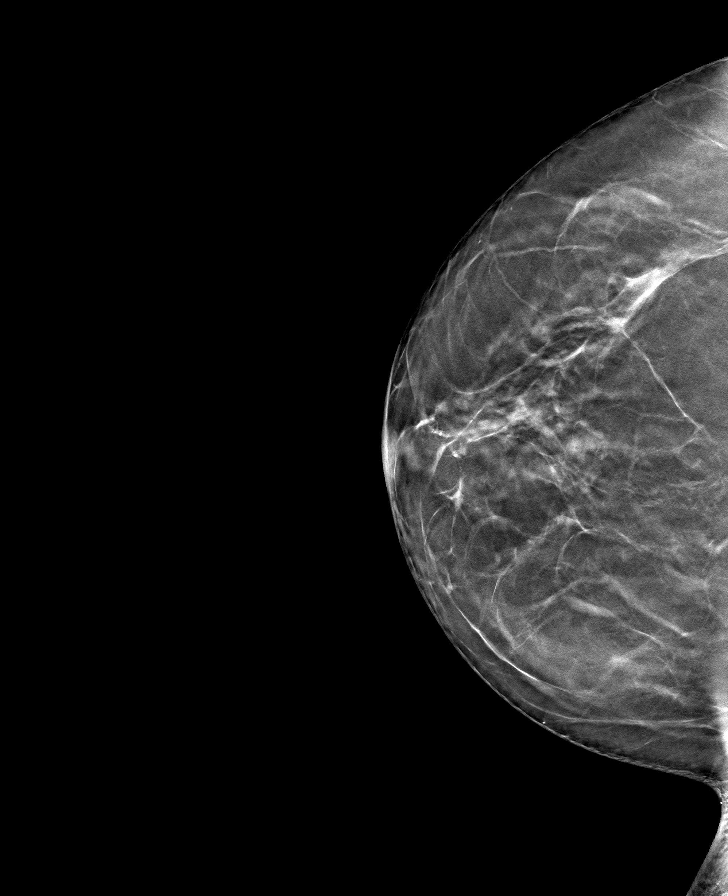

[L MLO tomo · tomo slice 47/92.0]
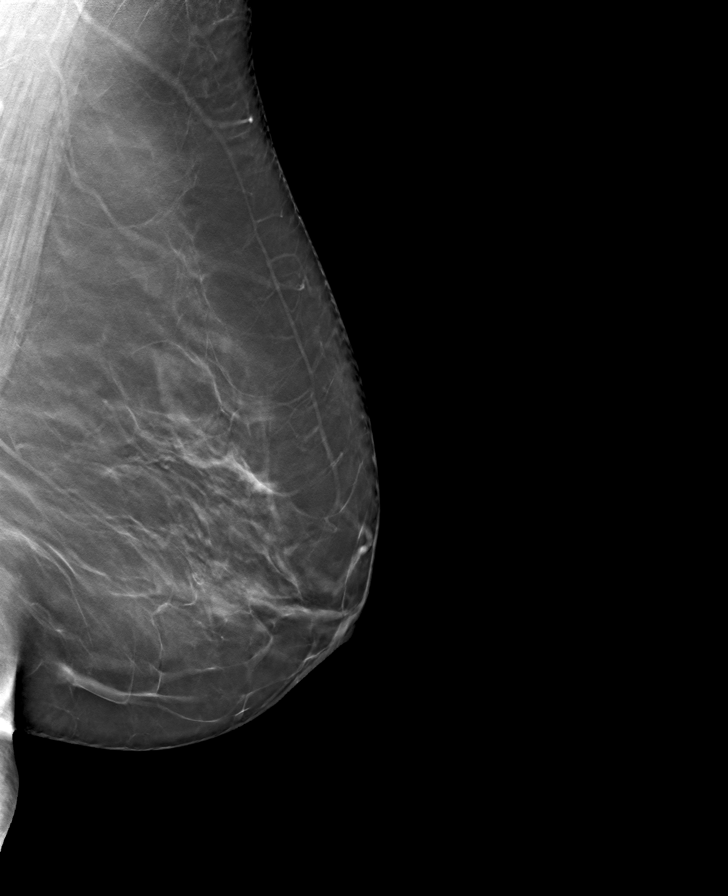

[L CC tomo · tomo slice 39/78.0]
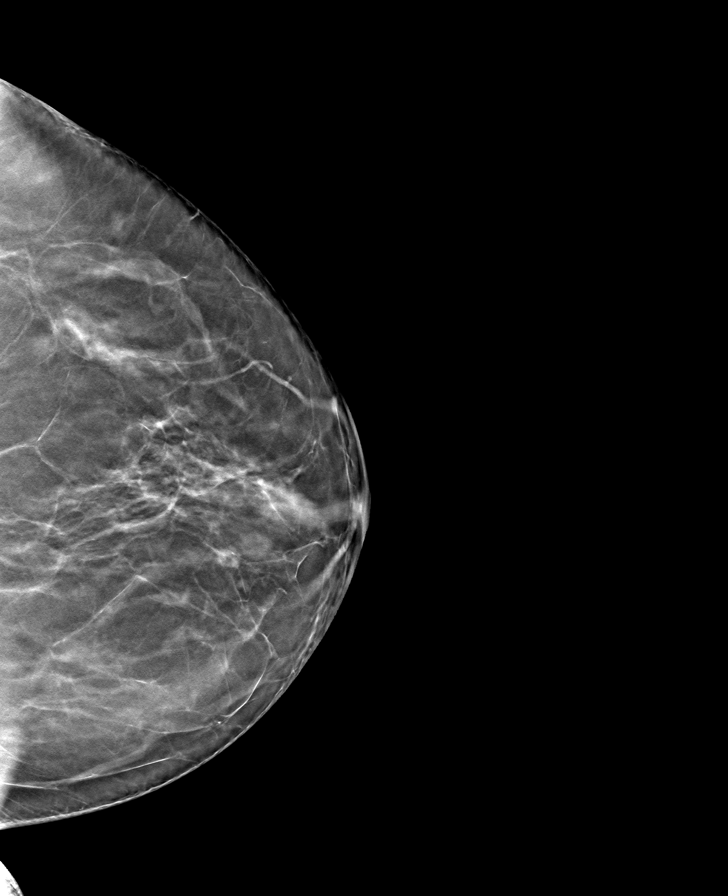

[8 of 24 positions shown; findings below may reference images not displayed]

ACR Breast Density Category b: There are scattered areas of
fibroglandular density.
FINDINGS: In the left breast, a possible mass warrants further evaluation. In
the right breast, no findings suspicious for malignancy.
IMPRESSION: Further evaluation is suggested for possible mass in the left
breast.

RECOMMENDATION:
Ultrasound of the left breast. (Code:B5-G-DD6)

The patient will be contacted regarding the findings, and additional
imaging will be scheduled.

BI-RADS CATEGORY  0: Incomplete. Need additional imaging evaluation
and/or prior mammograms for comparison.

## 2023-11-16 IMAGING — US US BREAST*L* LIMITED INC AXILLA
1 series · 5 of 5 positions shown · non-contrast
Comparison: Previous exams.

CLINICAL DATA: Callback for LEFT breast mass from screening
mammogram.

EXAM:
ULTRASOUND OF THE LEFT BREAST

[Series 1: us brst ltd uni left inc trans 20 · 5 of 5 slices shown]
[im 1/5]
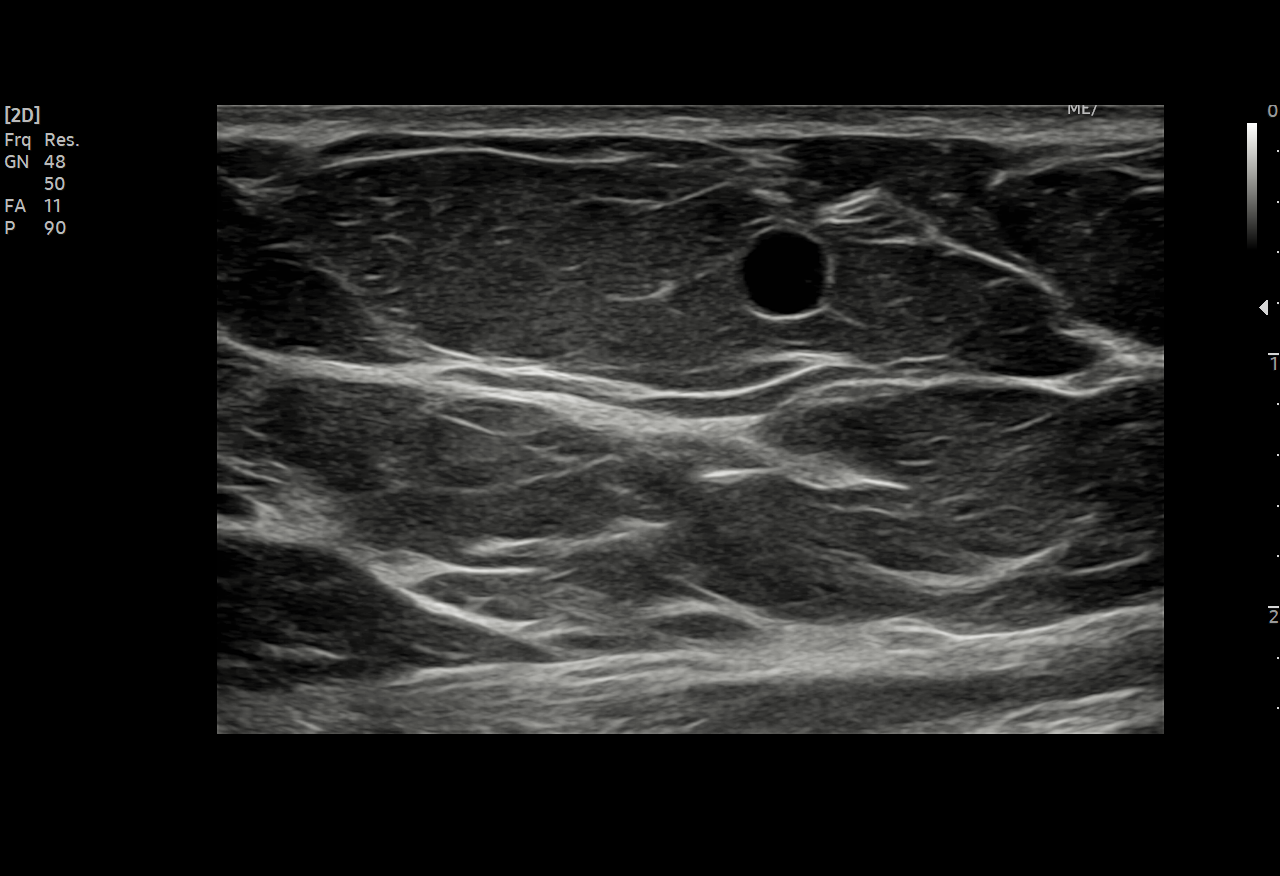
[im 2/5]
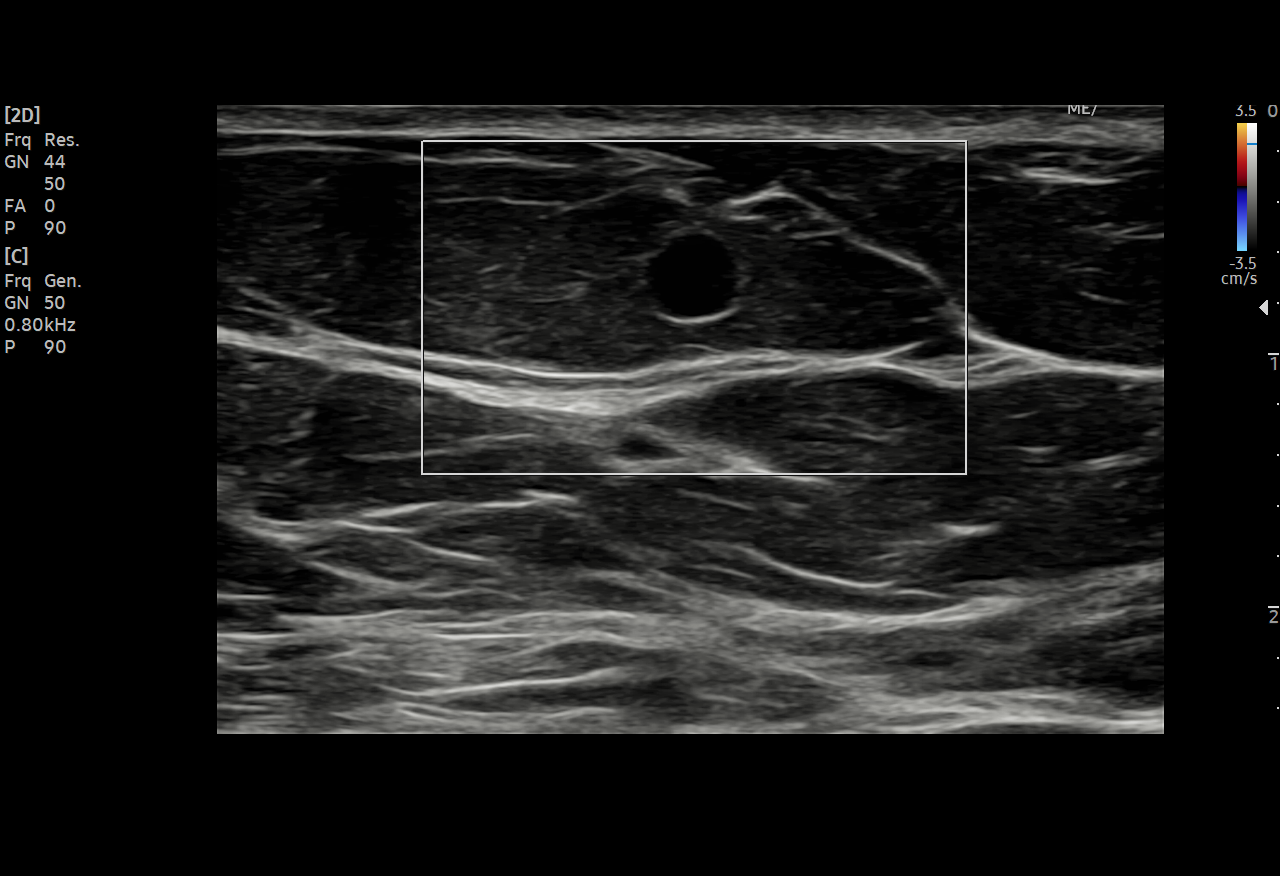
[im 3/5]
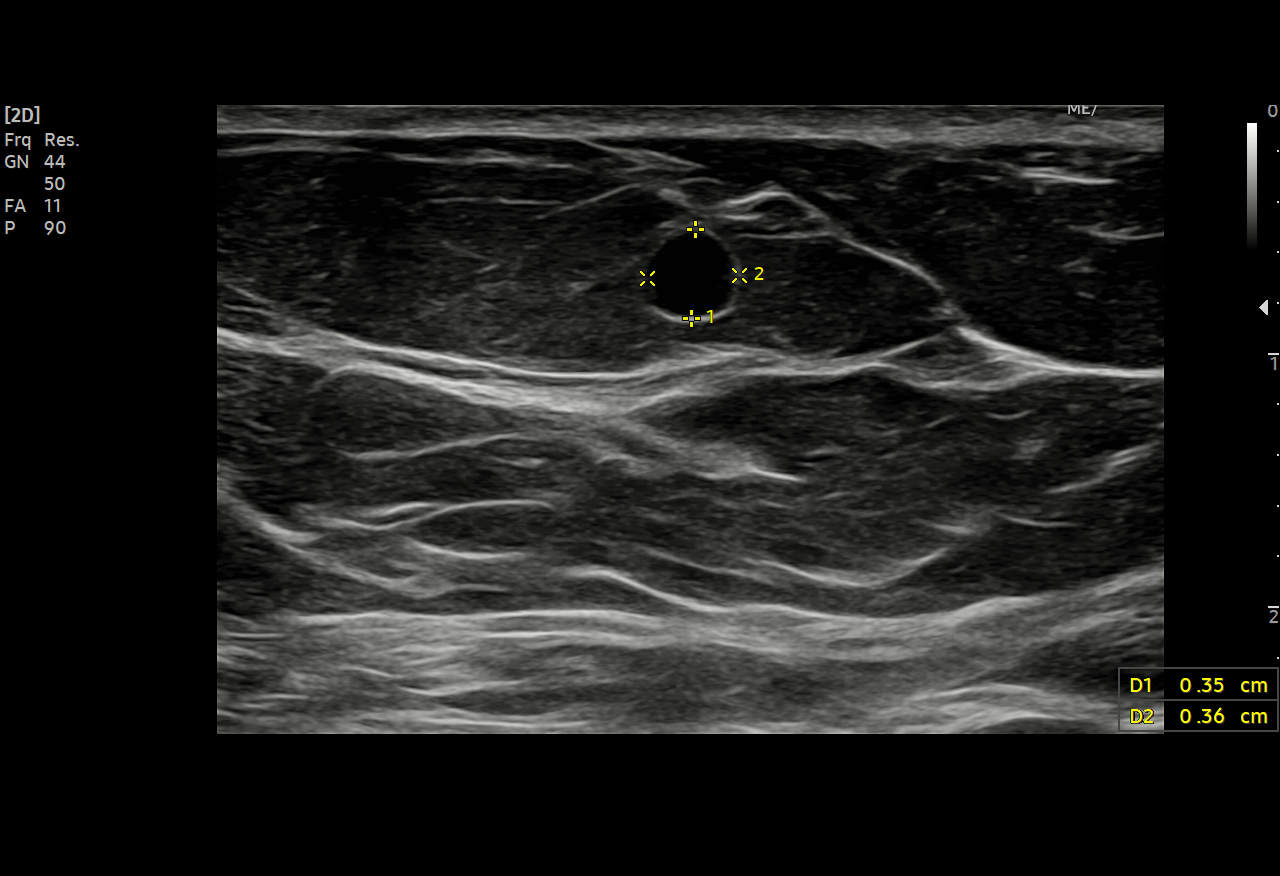
[im 4/5]
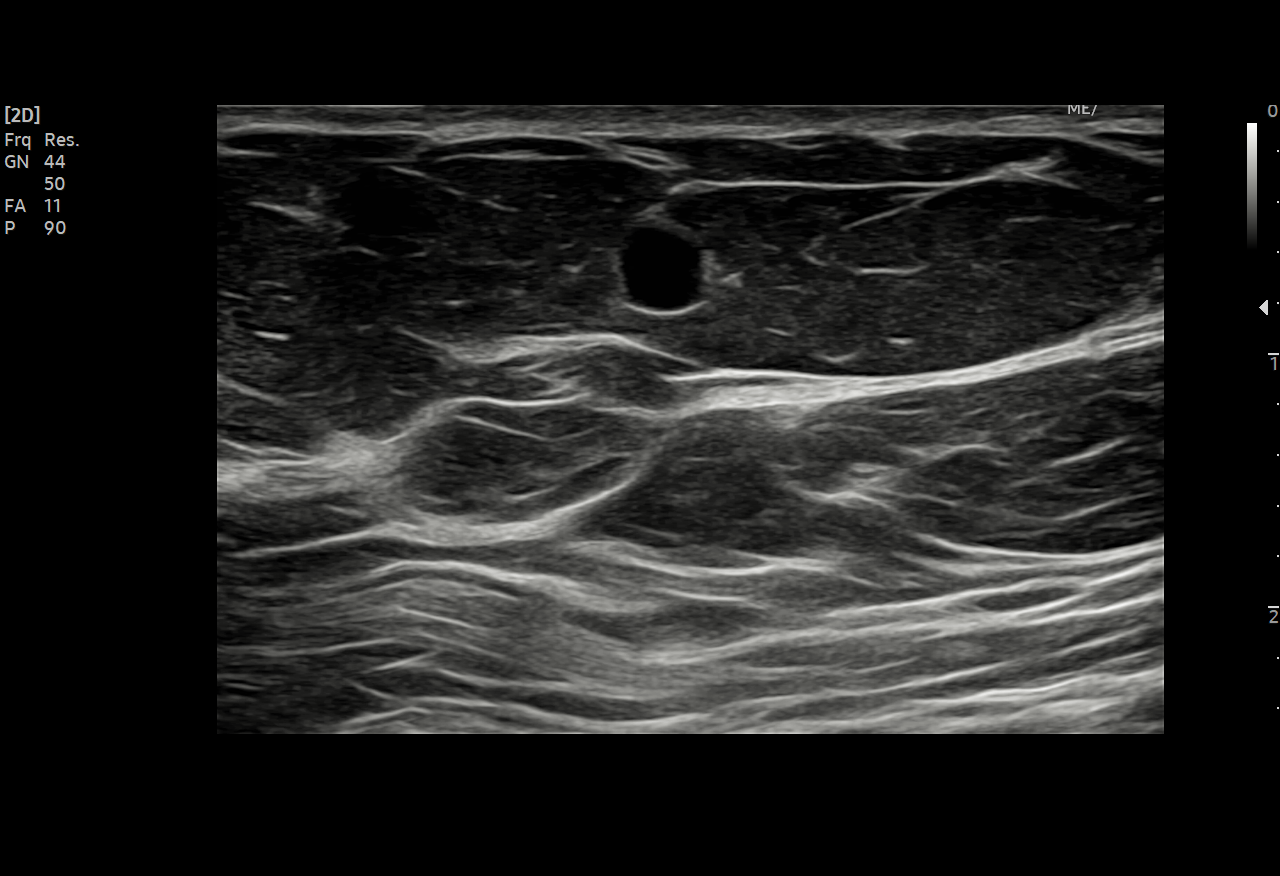
[im 5/5]
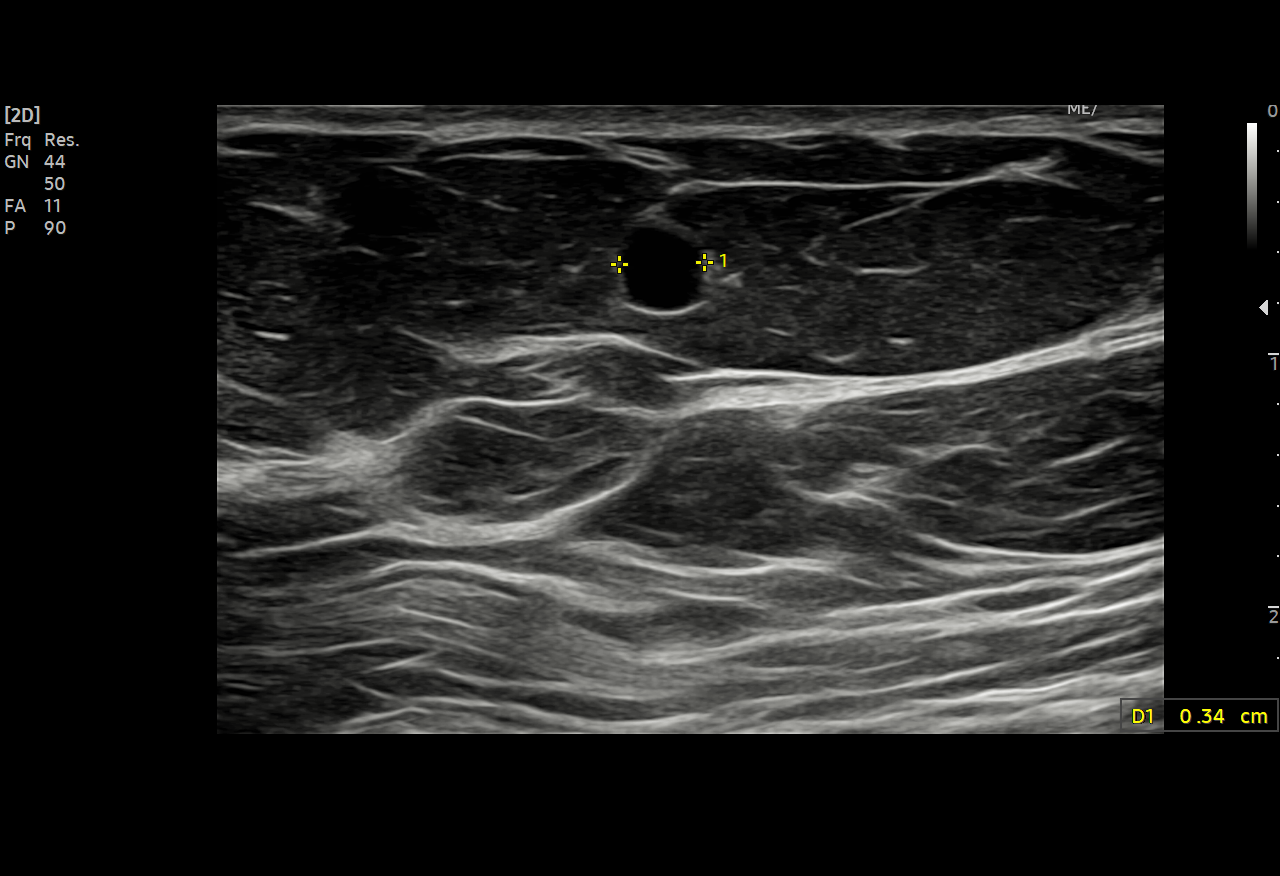

[5 of 5 positions shown; findings below may reference images not displayed]

FINDINGS: On physical exam, no suspicious mass is appreciated. Review of prior
mammogram demonstrates a superficial 3-4 mm oval circumscribed mass
in the LEFT outer breast at middle to posterior depth.

Targeted ultrasound was performed LEFT outer breast. At 3 o'clock 7
cm from the nipple, there is a superficial oval circumscribed
anechoic mass with posterior acoustic enhancement. It measures 4 x 4
x 3 mm and is consistent with a benign simple cyst. This corresponds
to the site of screening mammographic concern.
IMPRESSION: There is a benign cyst at the site of screening mammographic
concern.

RECOMMENDATION:
Screening mammogram in one year.(Code:Z6-B-2PB)

I have discussed the findings and recommendations with the patient.
If applicable, a reminder letter will be sent to the patient
regarding the next appointment.

BI-RADS CATEGORY  2: Benign.

## 2024-04-11 ENCOUNTER — Other Ambulatory Visit: Payer: Self-pay | Admitting: Family Medicine

## 2024-04-11 DIAGNOSIS — Z1231 Encounter for screening mammogram for malignant neoplasm of breast: Secondary | ICD-10-CM

## 2024-05-16 ENCOUNTER — Encounter: Payer: Self-pay | Admitting: Oncology

## 2024-05-16 ENCOUNTER — Telehealth: Payer: Self-pay

## 2024-05-16 ENCOUNTER — Inpatient Hospital Stay: Attending: Oncology | Admitting: Oncology

## 2024-05-16 ENCOUNTER — Inpatient Hospital Stay

## 2024-05-16 VITALS — BP 139/75 | HR 72 | Temp 96.9°F | Resp 18 | Wt 171.8 lb

## 2024-05-16 DIAGNOSIS — G479 Sleep disorder, unspecified: Secondary | ICD-10-CM | POA: Diagnosis not present

## 2024-05-16 DIAGNOSIS — I10 Essential (primary) hypertension: Secondary | ICD-10-CM | POA: Diagnosis not present

## 2024-05-16 DIAGNOSIS — D751 Secondary polycythemia: Secondary | ICD-10-CM | POA: Insufficient documentation

## 2024-05-16 DIAGNOSIS — R7989 Other specified abnormal findings of blood chemistry: Secondary | ICD-10-CM

## 2024-05-16 DIAGNOSIS — R519 Headache, unspecified: Secondary | ICD-10-CM | POA: Diagnosis not present

## 2024-05-16 LAB — CBC WITH DIFFERENTIAL/PLATELET
Abs Immature Granulocytes: 0 10*3/uL (ref 0.00–0.07)
Band Neutrophils: 0 %
Basophils Absolute: 0 10*3/uL (ref 0.0–0.1)
Basophils Relative: 1 %
Blasts: 0 %
Eosinophils Absolute: 0.1 10*3/uL (ref 0.0–0.5)
Eosinophils Relative: 3 %
HCT: 47.1 % — ABNORMAL HIGH (ref 36.0–46.0)
Hemoglobin: 16.1 g/dL — ABNORMAL HIGH (ref 12.0–15.0)
Immature Granulocytes: 0 %
Lymphocytes Relative: 37 %
Lymphs Abs: 1.5 10*3/uL (ref 0.7–4.0)
MCH: 29.7 pg (ref 26.0–34.0)
MCHC: 34.2 g/dL (ref 30.0–36.0)
MCV: 86.9 fL (ref 80.0–100.0)
Metamyelocytes Relative: 0 %
Monocytes Absolute: 0.4 10*3/uL (ref 0.1–1.0)
Monocytes Relative: 9 %
Myelocytes: 0 %
Neutro Abs: 2 10*3/uL (ref 1.7–7.7)
Neutrophils Relative %: 50 %
Other: 0 %
Platelets: 241 10*3/uL (ref 150–400)
Promyelocytes Relative: 0 %
RBC: 5.42 MIL/uL — ABNORMAL HIGH (ref 3.87–5.11)
RDW: 12.5 % (ref 11.5–15.5)
WBC: 4 10*3/uL (ref 4.0–10.5)
nRBC: 0 % (ref 0.0–0.2)
nRBC: 0 /100{WBCs}

## 2024-05-16 LAB — TECHNOLOGIST SMEAR REVIEW: Clinical Information: ABNORMAL

## 2024-05-16 LAB — VITAMIN B12: Vitamin B-12: 598 pg/mL (ref 180–914)

## 2024-05-16 LAB — FOLATE: Folate: 28 ng/mL (ref >5.9)

## 2024-05-16 NOTE — Telephone Encounter (Signed)
-----   Message from Timmy Forbes sent at 05/16/2024 12:58 PM EDT ----- Please let pt know that today's cbc test showed high red blood cell counts. I recommend additional work up. Labs are ordered. Please arrange.  I will see her 3-4 weeks to review results.  Thanks.     Allyne Areola

## 2024-05-16 NOTE — Assessment & Plan Note (Addendum)
 Repeat cbc showed erythrocytosis.  Labs are reviewed and discussed with patient.  Erythrocytosis is an abnormal elevation of hemoglobin and/or hematocrit in peripheral blood, and this can be caused by primary etiology, ie myeloproliferative disease, or secondary etiology, ie sleep apnea, smoking,etc  or familiar condition.  I will check erythropoietin, carbo monoxide level, JAK2 with reflex to other mutations, BCR-ABL1 FISH.

## 2024-05-16 NOTE — Telephone Encounter (Signed)
 Spoke to pt and informed her of MD recommendation.   Please schedule:   Lab on wed 5/21 @ 11:30a  MD on Jun 5 @ 10:15 ( pt will be out of town starting on 6/8 and doesn't know when she will come back). Pt aware of appts.

## 2024-05-16 NOTE — Progress Notes (Signed)
 Hematology/Oncology Consult note [phone] [phone]        REFERRING PROVIDER: Baltazar Leventhal, *   CHIEF COMPLAINTS/REASON FOR VISIT:  Evaluation of abnormal cbc   ASSESSMENT & PLAN:   Erythrocytosis Repeat cbc showed erythrocytosis.  Labs are reviewed and discussed with patient.  Erythrocytosis is an abnormal elevation of hemoglobin and/or hematocrit in peripheral blood, and this can be caused by primary etiology, ie myeloproliferative disease, or secondary etiology, ie sleep apnea, smoking,etc  or familiar condition.  I will check erythropoietin, carbo monoxide level, JAK2 with reflex to other mutations, BCR-ABL1 FISH.      Orders Placed This Encounter  Procedures   CBC with Differential/Platelet    Standing Status:   Future    Number of Occurrences:   1    Expected Date:   05/16/2024    Expiration Date:   05/16/2025   Technologist smear review    Standing Status:   Future    Number of Occurrences:   1    Expected Date:   05/16/2024    Expiration Date:   05/16/2025    Clinical information::   neutropenia, abnormal cbc   Vitamin B12    Standing Status:   Future    Number of Occurrences:   1    Expected Date:   05/16/2024    Expiration Date:   05/16/2025   Folate    Standing Status:   Future    Number of Occurrences:   1    Expected Date:   05/16/2024    Expiration Date:   05/16/2025   Follow up in 3-4 weeks to review results.  All questions were answered. The patient knows to call the clinic with any problems, questions or concerns.  Timmy Forbes, MD, PhD [facility] Hematology Oncology 05/16/2024   HISTORY OF PRESENTING ILLNESS:   Isabella Barnes is a  63 y.o.  female with PMH listed below was seen in consultation at the request of  Baltazar Leventhal, *  for evaluation of abnormal cbc   Discussed the use of AI scribe software for clinical note transcription with the patient, who gave verbal consent to proceed.   She had blood  work done on April 21, 2024, which showed several abnormalities including a low white blood cell count of 3.69, Hb 15.7, hct 48. A repeat CBC on May 03, 2024, showed a white blood cell count within normal range and an absolute neutrophil count of 1.8.  She has high blood pressure, which was noted during a doctor's appointment in April. She visited the ER due to uncontrolled hypertension and was given medication to lower it. She continues to experience light headaches almost daily, which she associates with her blood pressure issues.  She has difficulty sleeping, stating 'I have a hard time to go to sleep' at night. No unintentional weight loss, fever, night sweats, or changes in appetite. She wants to lose weight intentionally and has been taking over-the-counter medication for this purpose.    MEDICAL HISTORY:  Past Medical History:  Diagnosis Date   Allergic reaction    Vertigo     SURGICAL HISTORY: Past Surgical History:  Procedure Laterality Date   ABDOMINAL HYSTERECTOMY     BLADDER SURGERY     CESAREAN SECTION     HERNIA REPAIR     RECTAL SURGERY     TUBAL LIGATION      SOCIAL HISTORY: Social History   Socioeconomic History   Marital status: Married  Spouse name: Not on file   Number of children: Not on file   Years of education: Not on file   Highest education level: Not on file  Occupational History   Not on file  Tobacco Use   Smoking status: Never   Smokeless tobacco: Never  Vaping Use   Vaping status: Never Used  Substance and Sexual Activity   Alcohol use: No   Drug use: No   Sexual activity: Not on file  Other Topics Concern   Not on file  Social History Narrative   Not on file   Social Drivers of Health   Financial Resource Strain: Not on file  Food Insecurity: Not on file  Transportation Needs: Not on file  Physical Activity: Not on file  Stress: Not on file  Social Connections: Not on file  Intimate Partner Violence: Not At Risk (08/15/2022)    Received from [facility]    Threatened: Not on file    Insulted: Not on file    Physically Hurt : Not on file    Scream: Not on file    FAMILY HISTORY: Family History  Problem Relation Age of Onset   Breast cancer Mother 39    ALLERGIES:  is allergic to influenza vaccines, benzocaine-menthol, chloraseptic sore throat [acetaminophen], diflucan [fluconazole], metronidazole, multivitamin [centrum], multivitamins, other, penicillins, phenol, and sulfa antibiotics.  MEDICATIONS:  Current Outpatient Medications  Medication Sig Dispense Refill   alendronate (FOSAMAX) 70 MG/75ML solution Take 70 mg by mouth every 7 (seven) days. Take with a full glass of water on an empty stomach.     amLODipine-benazepril (LOTREL) 10-20 MG capsule Take 1 capsule by mouth.     atorvastatin (LIPITOR) 40 MG tablet Take 40 mg by mouth daily.     benazepril (LOTENSIN) 20 MG tablet Take 20 mg by mouth daily.     celecoxib (CELEBREX) 100 MG capsule Take 100 mg by mouth 2 (two) times daily.     cetirizine (ZYRTEC) 10 MG tablet Take 1 tablet by mouth daily.     Cholecalciferol (VITAMIN D3) 250 MCG (10000 UT) capsule Take 10,000 Units by mouth daily.     cyanocobalamin (VITAMIN B12) 1000 MCG tablet Take 1,000 mcg by mouth daily.     omeprazole (PRILOSEC) 20 MG capsule Take 1 capsule by mouth daily.     potassium chloride  SA (KLOR-CON  M) 20 MEQ tablet Take 20 mEq by mouth 2 (two) times daily.     amoxicillin-clavulanate (AUGMENTIN) 875-125 MG tablet  (Patient not taking: Reported on 05/16/2024)     meloxicam  (MOBIC ) 15 MG tablet TAKE 1 TABLET BY MOUTH EVERY DAY (Patient not taking: Reported on 05/16/2024) 30 tablet 0   No current facility-administered medications for this visit.    Review of Systems  Constitutional:  Negative for appetite change, chills, fatigue and fever.  HENT:   Negative for hearing loss and voice change.   Eyes:  Negative for eye problems.  Respiratory:  Negative for chest  tightness and cough.   Cardiovascular:  Negative for chest pain.  Gastrointestinal:  Negative for abdominal distention, abdominal pain and blood in stool.  Endocrine: Negative for hot flashes.  Genitourinary:  Negative for difficulty urinating and frequency.   Musculoskeletal:  Negative for arthralgias.  Skin:  Negative for itching and rash.  Neurological:  Negative for extremity weakness.  Hematological:  Negative for adenopathy.  Psychiatric/Behavioral:  Positive for sleep disturbance. Negative for confusion.    PHYSICAL EXAMINATION: ECOG PERFORMANCE STATUS: 0 -  Asymptomatic Vitals:   05/16/24 0944 05/16/24 0958  BP: (!) 153/84 139/75  Pulse: 72   Resp: 18   Temp: (!) 96.9 F (36.1 C)   SpO2: 98%    Filed Weights   05/16/24 0944  Weight: 171 lb 12.8 oz (77.9 kg)    Physical Exam Constitutional:      General: She is not in acute distress. HENT:     Head: Normocephalic and atraumatic.  Eyes:     General: No scleral icterus. Cardiovascular:     Rate and Rhythm: Normal rate and regular rhythm.     Heart sounds: Normal heart sounds.  Pulmonary:     Effort: Pulmonary effort is normal. No respiratory distress.     Breath sounds: No wheezing.  Abdominal:     General: Bowel sounds are normal. There is no distension.     Palpations: Abdomen is soft.  Musculoskeletal:        General: No deformity. Normal range of motion.     Cervical back: Normal range of motion and neck supple.  Skin:    General: Skin is warm and dry.     Findings: No erythema or rash.  Neurological:     Mental Status: She is alert and oriented to person, place, and time. Mental status is at baseline.  Psychiatric:        Mood and Affect: Mood normal.     LABORATORY DATA:  I have reviewed the data as listed    Latest Ref Rng & Units 05/16/2024   10:35 AM 12/09/2017   12:39 PM 09/28/2015    9:50 AM  CBC  WBC 4.0 - 10.5 K/uL 4.0  5.0  4.5   Hemoglobin 12.0 - 15.0 g/dL 09.8  11.9  14.7    Hematocrit 36.0 - 46.0 % 47.1  47.0  48.1   Platelets 150 - 400 K/uL 241  324  234       Latest Ref Rng & Units 12/09/2017   12:39 PM 09/28/2015   12:00 PM  CMP  Glucose 65 - 99 mg/dL 829  99   BUN 6 - 20 mg/dL 14  14   Creatinine 5.62 - 1.00 mg/dL 1.30  8.65   Sodium 784 - 145 mmol/L 141  141   Potassium 3.5 - 5.1 mmol/L 3.3  3.4   Chloride 101 - 111 mmol/L 104  108   CO2 22 - 32 mmol/L 26  26   Calcium 8.9 - 10.3 mg/dL 9.4  9.4   Total Protein 6.5 - 8.1 g/dL  8.2   Total Bilirubin 0.3 - 1.2 mg/dL  1.5   Alkaline Phos 38 - 126 U/L  128   AST 15 - 41 U/L  26   ALT 14 - 54 U/L  28       RADIOGRAPHIC STUDIES: I have personally reviewed the radiological images as listed and agreed with the findings in the report. No results found.

## 2024-05-18 ENCOUNTER — Inpatient Hospital Stay

## 2024-05-25 ENCOUNTER — Inpatient Hospital Stay

## 2024-05-25 DIAGNOSIS — D751 Secondary polycythemia: Secondary | ICD-10-CM

## 2024-05-26 LAB — CARBON MONOXIDE, BLOOD (PERFORMED AT REF LAB): Carbon Monoxide, Blood: 3.2 % (ref 0.0–3.6)

## 2024-05-27 LAB — ERYTHROPOIETIN: Erythropoietin: 14.9 m[IU]/mL (ref 2.6–18.5)

## 2024-05-30 LAB — BCR-ABL1 FISH
Cells Analyzed: 200
Cells Counted: 200

## 2024-05-31 LAB — JAK2 V617F RFX CALR/MPL/E12-15

## 2024-05-31 LAB — CALR +MPL + E12-E15  (REFLEX)

## 2024-06-02 ENCOUNTER — Telehealth: Payer: Self-pay

## 2024-06-02 ENCOUNTER — Encounter: Payer: Self-pay | Admitting: Oncology

## 2024-06-02 ENCOUNTER — Inpatient Hospital Stay: Attending: Oncology | Admitting: Oncology

## 2024-06-02 VITALS — BP 141/83 | HR 69 | Temp 96.7°F | Resp 18 | Wt 174.8 lb

## 2024-06-02 DIAGNOSIS — D751 Secondary polycythemia: Secondary | ICD-10-CM | POA: Diagnosis present

## 2024-06-02 NOTE — Assessment & Plan Note (Signed)
 Repeat cbc showed erythrocytosis.  Labs are reviewed and discussed with patient.  Work up showed normal erythropoietin , negative BCR-ABL1 FISH, JAK2 V617F mutation negative, with reflex to other mutations CALR, MPL, JAK 2 Ex 12-15 mutations negative, less likely myeloproliferative disease.  Recommend further work up of secondary causes. Recommend sleep studies. She declined being referred to sleep medicine as she will travel with husband and stay out of [place] for months. She plans to discuss with her PCP in [place]  and obtain studies.  She plans to follow up with me in 1 year.

## 2024-06-02 NOTE — Addendum Note (Signed)
 Addended by: Craven Do on: 06/02/2024 04:40 PM   Modules accepted: Orders

## 2024-06-02 NOTE — Progress Notes (Signed)
 Hematology/Oncology Consult note [phone] [phone]        REFERRING PROVIDER: Baltazar Leventhal, *   CHIEF COMPLAINTS/REASON FOR VISIT:  Erythrocytosis    ASSESSMENT & PLAN:   Erythrocytosis Repeat cbc showed erythrocytosis.  Labs are reviewed and discussed with patient.  Work up showed normal erythropoietin , negative BCR-ABL1 FISH, JAK2 V617F mutation negative, with reflex to other mutations CALR, MPL, JAK 2 Ex 12-15 mutations negative, less likely myeloproliferative disease.  Recommend further work up of secondary causes. Recommend sleep studies. She declined being referred to sleep medicine as she will travel with husband and stay out of [place] for months. She plans to discuss with her PCP in [place]  and obtain studies.  She plans to follow up with me in 1 year.     Orders Placed This Encounter  Procedures   CBC with Differential (Cancer Center Only)    Standing Status:   Future    Expected Date:   03/02/2025    Expiration Date:   06/02/2025   CMP (Cancer Center only)    Standing Status:   Future    Expected Date:   03/02/2025    Expiration Date:   06/02/2025   Follow up in 1 year  All questions were answered. The patient knows to call the clinic with any problems, questions or concerns.  Timmy Forbes, MD, PhD [facility] Hematology Oncology 06/02/2024   HISTORY OF PRESENTING ILLNESS:   Isabella Barnes is a  63 y.o.  female with PMH listed below was seen in consultation at the request of  Baltazar Leventhal, *  for evaluation of abnormal cbc   Discussed the use of AI scribe software for clinical note transcription with the patient, who gave verbal consent to proceed.   She had blood work done on April 21, 2024, which showed several abnormalities including a low white blood cell count of 3.69, Hb 15.7, hct 48. A repeat CBC on May 03, 2024, showed a white blood cell count within normal range and an absolute neutrophil count of 1.8.  She has  high blood pressure, which was noted during a doctor's appointment in April. She visited the ER due to uncontrolled hypertension and was given medication to lower it. She continues to experience light headaches almost daily, which she associates with her blood pressure issues.  She has difficulty sleeping, stating 'I have a hard time to go to sleep' at night. No unintentional weight loss, fever, night sweats, or changes in appetite. She wants to lose weight intentionally and has been taking over-the-counter medication for this purpose.  INTERVAL HISTORY Isabella Barnes is a 63 y.o. female who has above history reviewed by me today presents for follow up visit for erythrocytosis.  She presents to discuss results.   MEDICAL HISTORY:  Past Medical History:  Diagnosis Date   Allergic reaction    Vertigo     SURGICAL HISTORY: Past Surgical History:  Procedure Laterality Date   ABDOMINAL HYSTERECTOMY     BLADDER SURGERY     CESAREAN SECTION     HERNIA REPAIR     RECTAL SURGERY     TUBAL LIGATION      SOCIAL HISTORY: Social History   Socioeconomic History   Marital status: Married    Spouse name: Not on file   Number of children: Not on file   Years of education: Not on file   Highest education level: Not on file  Occupational History   Not on file  Tobacco Use   Smoking status: Never   Smokeless tobacco: Never  Vaping Use   Vaping status: Never Used  Substance and Sexual Activity   Alcohol use: No   Drug use: No   Sexual activity: Not on file  Other Topics Concern   Not on file  Social History Narrative   Not on file   Social Drivers of Health   Financial Resource Strain: Not on file  Food Insecurity: No Food Insecurity (06/02/2024)   Hunger Vital Sign    Worried About Running Out of Food in the Last Year: Never true    Ran Out of Food in the Last Year: Never true  Transportation Needs: No Transportation Needs (06/02/2024)   PRAPARE - Scientist, research (physical sciences) (Medical): No    Lack of Transportation (Non-Medical): No  Physical Activity: Not on file  Stress: Not on file  Social Connections: Not on file  Intimate Partner Violence: Not At Risk (08/15/2022)   Received from [facility]    Threatened: Not on file    Insulted: Not on file    Physically Hurt : Not on file    Scream: Not on file    FAMILY HISTORY: Family History  Problem Relation Age of Onset   Breast cancer Mother 40    ALLERGIES:  is allergic to influenza vaccines, benzocaine-menthol, chloraseptic sore throat [acetaminophen], diflucan [fluconazole], metronidazole, multivitamin [centrum], multivitamins, other, penicillins, phenol, and sulfa antibiotics.  MEDICATIONS:  Current Outpatient Medications  Medication Sig Dispense Refill   alendronate (FOSAMAX) 70 MG/75ML solution Take 70 mg by mouth every 7 (seven) days. Take with a full glass of water on an empty stomach.     amLODipine-benazepril (LOTREL) 10-20 MG capsule Take 1 capsule by mouth.     atorvastatin (LIPITOR) 40 MG tablet Take 40 mg by mouth daily.     benazepril (LOTENSIN) 20 MG tablet Take 20 mg by mouth daily.     Butalbital-APAP-Caffeine 50-300-40 MG CAPS Take 1 capsule by mouth.     celecoxib (CELEBREX) 100 MG capsule Take 100 mg by mouth 2 (two) times daily.     cetirizine (ZYRTEC) 10 MG tablet Take 1 tablet by mouth daily.     Cholecalciferol (VITAMIN D3) 250 MCG (10000 UT) capsule Take 10,000 Units by mouth daily.     cyanocobalamin  (VITAMIN B12) 1000 MCG tablet Take 1,000 mcg by mouth daily.     omeprazole (PRILOSEC) 20 MG capsule Take 1 capsule by mouth daily.     potassium chloride  SA (KLOR-CON  M) 20 MEQ tablet Take 20 mEq by mouth 2 (two) times daily.     amoxicillin-clavulanate (AUGMENTIN) 875-125 MG tablet  (Patient not taking: Reported on 06/02/2024)     meloxicam  (MOBIC ) 15 MG tablet TAKE 1 TABLET BY MOUTH EVERY DAY (Patient not taking: Reported on 06/02/2024) 30 tablet 0   No  current facility-administered medications for this visit.    Review of Systems  Constitutional:  Negative for appetite change, chills, fatigue and fever.  HENT:   Negative for hearing loss and voice change.   Eyes:  Negative for eye problems.  Respiratory:  Negative for chest tightness and cough.   Cardiovascular:  Negative for chest pain.  Gastrointestinal:  Negative for abdominal distention, abdominal pain and blood in stool.  Endocrine: Negative for hot flashes.  Genitourinary:  Negative for difficulty urinating and frequency.   Musculoskeletal:  Negative for arthralgias.  Skin:  Negative for itching and rash.  Neurological:  Negative for extremity weakness.  Hematological:  Negative for adenopathy.  Psychiatric/Behavioral:  Positive for sleep disturbance. Negative for confusion.    PHYSICAL EXAMINATION: ECOG PERFORMANCE STATUS: 0 - Asymptomatic Vitals:   06/02/24 1031 06/02/24 1046  BP: (!) 153/84 (!) 141/83  Pulse: 69   Resp: 18   Temp: (!) 96.7 F (35.9 C)   SpO2: 98%    Filed Weights   06/02/24 1031  Weight: 174 lb 12.8 oz (79.3 kg)    Physical Exam Constitutional:      General: She is not in acute distress. HENT:     Head: Normocephalic and atraumatic.  Eyes:     General: No scleral icterus. Cardiovascular:     Rate and Rhythm: Normal rate and regular rhythm.     Heart sounds: Normal heart sounds.  Pulmonary:     Effort: Pulmonary effort is normal. No respiratory distress.     Breath sounds: No wheezing.  Abdominal:     General: Bowel sounds are normal. There is no distension.     Palpations: Abdomen is soft.  Musculoskeletal:        General: No deformity. Normal range of motion.     Cervical back: Normal range of motion and neck supple.  Skin:    General: Skin is warm and dry.     Findings: No erythema or rash.  Neurological:     Mental Status: She is alert and oriented to person, place, and time. Mental status is at baseline.  Psychiatric:         Mood and Affect: Mood normal.     LABORATORY DATA:  I have reviewed the data as listed    Latest Ref Rng & Units 05/16/2024   10:35 AM 12/09/2017   12:39 PM 09/28/2015    9:50 AM  CBC  WBC 4.0 - 10.5 K/uL 4.0  5.0  4.5   Hemoglobin 12.0 - 15.0 g/dL 91.4  78.2  95.6   Hematocrit 36.0 - 46.0 % 47.1  47.0  48.1   Platelets 150 - 400 K/uL 241  324  234       Latest Ref Rng & Units 12/09/2017   12:39 PM 09/28/2015   12:00 PM  CMP  Glucose 65 - 99 mg/dL 213  99   BUN 6 - 20 mg/dL 14  14   Creatinine 0.86 - 1.00 mg/dL 5.78  4.69   Sodium 629 - 145 mmol/L 141  141   Potassium 3.5 - 5.1 mmol/L 3.3  3.4   Chloride 101 - 111 mmol/L 104  108   CO2 22 - 32 mmol/L 26  26   Calcium 8.9 - 10.3 mg/dL 9.4  9.4   Total Protein 6.5 - 8.1 g/dL  8.2   Total Bilirubin 0.3 - 1.2 mg/dL  1.5   Alkaline Phos 38 - 126 U/L  128   AST 15 - 41 U/L  26   ALT 14 - 54 U/L  28       RADIOGRAPHIC STUDIES: I have personally reviewed the radiological images as listed and agreed with the findings in the report. No results found.

## 2024-06-02 NOTE — Telephone Encounter (Signed)
 Pt called back requesting to have sleep study done ASAP. Referral sent to pulmonology.   Called pt and she requested that referral be expedited because she will be going on vacation on Monday. I told her that referral was placed and she could call pulmonology office to set up appt, but that I wasn't sure she would be seen by Monday. Ph number to pulmonology given to pt and she verbalized understanding.

## 2024-06-06 ENCOUNTER — Ambulatory Visit: Admitting: Sleep Medicine

## 2024-11-21 ENCOUNTER — Encounter

## 2024-11-21 ENCOUNTER — Ambulatory Visit
Admission: RE | Admit: 2024-11-21 | Discharge: 2024-11-21 | Disposition: A | Discharge status: Home / Self Care | Source: Ambulatory Visit | Attending: Family Medicine | Admitting: Family Medicine

## 2024-11-21 DIAGNOSIS — Z1231 Encounter for screening mammogram for malignant neoplasm of breast: Secondary | ICD-10-CM | POA: Diagnosis present

## 2025-06-02 ENCOUNTER — Ambulatory Visit: Admitting: Oncology

## 2025-06-02 ENCOUNTER — Other Ambulatory Visit
